# Patient Record
Sex: Male | Born: 2005 | Race: Asian | Hispanic: No | Marital: Single | Smoking: Never smoker
Health system: Southern US, Community
[De-identification: ages and names within clinical notes are randomized; demographics above are authoritative.]

## PROBLEM LIST (undated history)

## (undated) HISTORY — PX: CIRCUMCISION: SUR203

---
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
## 2024-01-01 LAB — TIQ-NTM

## 2024-01-30 LAB — CULT, FUNGUS, SKIN,HAIR,NAIL W/KOH
CULTURE:: NO GROWTH
MICRO NUMBER:: 16651615
SMEAR:: NONE SEEN
SPECIMEN QUALITY:: ADEQUATE

## 2024-02-06 ENCOUNTER — Ambulatory Visit: Payer: Self-pay | Admitting: Podiatry

## 2024-02-07 NOTE — Progress Notes (Signed)
 Spoke to mom informed will try the over the counter nail gel.
